# Patient Record
Sex: Male | Born: 1955
Health system: Southern US, Community
[De-identification: ages and names within clinical notes are randomized; demographics above are authoritative.]

---

## 1997-10-14 ENCOUNTER — Emergency Department (HOSPITAL_COMMUNITY): Admission: EM | Admit: 1997-10-14 | Discharge: 1997-10-14 | Payer: Self-pay | Admitting: Emergency Medicine

## 1997-10-22 ENCOUNTER — Emergency Department (HOSPITAL_COMMUNITY): Admission: EM | Admit: 1997-10-22 | Discharge: 1997-10-22 | Payer: Self-pay | Admitting: Internal Medicine

## 2011-02-19 ENCOUNTER — Emergency Department (HOSPITAL_COMMUNITY)
Admission: EM | Admit: 2011-02-19 | Discharge: 2011-02-20 | Disposition: A | Discharge status: Home / Self Care | Payer: Worker's Compensation | Attending: Emergency Medicine | Admitting: Emergency Medicine

## 2011-02-19 ENCOUNTER — Emergency Department (HOSPITAL_COMMUNITY): Payer: Worker's Compensation

## 2011-02-19 DIAGNOSIS — Y9229 Other specified public building as the place of occurrence of the external cause: Secondary | ICD-10-CM | POA: Insufficient documentation

## 2011-02-19 DIAGNOSIS — S62339A Displaced fracture of neck of unspecified metacarpal bone, initial encounter for closed fracture: Secondary | ICD-10-CM | POA: Insufficient documentation

## 2011-02-19 DIAGNOSIS — IMO0002 Reserved for concepts with insufficient information to code with codable children: Secondary | ICD-10-CM | POA: Insufficient documentation

## 2011-02-19 DIAGNOSIS — M79609 Pain in unspecified limb: Secondary | ICD-10-CM | POA: Insufficient documentation

## 2011-02-19 DIAGNOSIS — M7989 Other specified soft tissue disorders: Secondary | ICD-10-CM | POA: Insufficient documentation

## 2011-02-19 DIAGNOSIS — S63259A Unspecified dislocation of unspecified finger, initial encounter: Secondary | ICD-10-CM | POA: Insufficient documentation

## 2011-02-20 ENCOUNTER — Emergency Department (HOSPITAL_COMMUNITY): Payer: Worker's Compensation

## 2015-07-11 ENCOUNTER — Ambulatory Visit: Payer: Self-pay

## 2015-07-11 ENCOUNTER — Other Ambulatory Visit: Payer: Self-pay | Admitting: Occupational Medicine

## 2015-07-11 DIAGNOSIS — Z0000 Encounter for general adult medical examination without abnormal findings: Secondary | ICD-10-CM

## 2015-11-05 ENCOUNTER — Other Ambulatory Visit: Payer: Self-pay

## 2015-11-05 ENCOUNTER — Encounter (HOSPITAL_COMMUNITY): Payer: Self-pay | Admitting: Emergency Medicine

## 2015-11-05 ENCOUNTER — Emergency Department (HOSPITAL_COMMUNITY): Payer: No Typology Code available for payment source

## 2015-11-05 ENCOUNTER — Emergency Department (HOSPITAL_COMMUNITY)
Admission: EM | Admit: 2015-11-05 | Discharge: 2015-11-05 | Disposition: A | Discharge status: Home / Self Care | Payer: No Typology Code available for payment source | Attending: Emergency Medicine | Admitting: Emergency Medicine

## 2015-11-05 DIAGNOSIS — I2699 Other pulmonary embolism without acute cor pulmonale: Secondary | ICD-10-CM | POA: Diagnosis not present

## 2015-11-05 DIAGNOSIS — Y92410 Unspecified street and highway as the place of occurrence of the external cause: Secondary | ICD-10-CM | POA: Insufficient documentation

## 2015-11-05 DIAGNOSIS — S2020XA Contusion of thorax, unspecified, initial encounter: Secondary | ICD-10-CM | POA: Insufficient documentation

## 2015-11-05 DIAGNOSIS — R0789 Other chest pain: Secondary | ICD-10-CM

## 2015-11-05 DIAGNOSIS — S40012A Contusion of left shoulder, initial encounter: Secondary | ICD-10-CM | POA: Diagnosis not present

## 2015-11-05 DIAGNOSIS — S161XXA Strain of muscle, fascia and tendon at neck level, initial encounter: Secondary | ICD-10-CM | POA: Diagnosis not present

## 2015-11-05 DIAGNOSIS — Y999 Unspecified external cause status: Secondary | ICD-10-CM | POA: Insufficient documentation

## 2015-11-05 DIAGNOSIS — S199XXA Unspecified injury of neck, initial encounter: Secondary | ICD-10-CM | POA: Diagnosis present

## 2015-11-05 DIAGNOSIS — Y939 Activity, unspecified: Secondary | ICD-10-CM | POA: Diagnosis not present

## 2015-11-05 LAB — I-STAT CHEM 8, ED
BUN: 10 mg/dL (ref 6–20)
CHLORIDE: 103 mmol/L (ref 101–111)
CREATININE: 0.9 mg/dL (ref 0.61–1.24)
Calcium, Ion: 1.14 mmol/L (ref 1.12–1.23)
GLUCOSE: 142 mg/dL — AB (ref 65–99)
HCT: 48 % (ref 39.0–52.0)
Hemoglobin: 16.3 g/dL (ref 13.0–17.0)
POTASSIUM: 3.9 mmol/L (ref 3.5–5.1)
Sodium: 140 mmol/L (ref 135–145)
TCO2: 25 mmol/L (ref 0–100)

## 2015-11-05 LAB — ANTITHROMBIN III: AntiThromb III Func: 90 % (ref 75–120)

## 2015-11-05 MED ORDER — RIVAROXABAN 15 MG PO TABS: 15.0000 mg | ORAL_TABLET | Freq: Two times a day (BID) | ORAL | Status: DC

## 2015-11-05 MED ORDER — RIVAROXABAN 15 MG PO TABS
15.0000 mg | ORAL_TABLET | Freq: Once | ORAL | Status: AC
  Administered 2015-11-05: 15 mg via ORAL
  Filled 2015-11-05: qty 1

## 2015-11-05 MED ORDER — RIVAROXABAN (XARELTO) EDUCATION KIT FOR DVT/PE PATIENTS
PACK | Freq: Once | Status: AC
  Administered 2015-11-05: 21:00:00
  Filled 2015-11-05: qty 1

## 2015-11-05 MED ORDER — IOPAMIDOL (ISOVUE-300) INJECTION 61%
INTRAVENOUS | Status: AC
  Administered 2015-11-05: 75 mL
  Filled 2015-11-05: qty 75

## 2015-11-05 MED ORDER — RIVAROXABAN (XARELTO) VTE STARTER PACK (15 & 20 MG): ORAL_TABLET | ORAL | Status: AC

## 2015-11-05 NOTE — Discharge Instructions (Signed)
Take your medications twice daily as directed. Call first thing Monday morning to schedule a follow up appointment with your primary care provider.  Return to ER for shortness of breath / difficulty breathing, new cough, chest pain, new or worsening symptoms, any additional concerns.   [facility] [phone] [facility]: [address], [place] [address] [phone]
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
## 2015-11-06 LAB — BETA-2-GLYCOPROTEIN I ABS, IGG/M/A
Beta-2 Glyco I IgG: <9 GPI IgG units (ref 0–20)
Beta-2-Glycoprotein I IgA: <9 GPI IgA units (ref 0–25)
Beta-2-Glycoprotein I IgM: <9 GPI IgM units (ref 0–32)

## 2015-11-07 LAB — LUPUS ANTICOAGULANT PANEL
DRVVT: 43.5 s (ref 0.0–47.0)
PTT LA: 31.6 s (ref 0.0–43.6)

## 2015-11-07 LAB — PROTEIN C ACTIVITY: Protein C Activity: 90 % (ref 73–180)

## 2015-11-07 LAB — PROTEIN S, TOTAL: PROTEIN S AG TOTAL: 92 % (ref 60–150)

## 2015-11-07 LAB — PROTEIN S ACTIVITY: Protein S Activity: 111 % (ref 63–140)

## 2015-11-07 LAB — HOMOCYSTEINE: Homocysteine: 13 umol/L (ref 0.0–15.0)

## 2015-11-07 LAB — CARDIOLIPIN ANTIBODIES, IGG, IGM, IGA
Anticardiolipin IgA: 12 APL U/mL — ABNORMAL HIGH (ref 0–11)
Anticardiolipin IgG: <9 GPL U/mL (ref 0–14)

## 2015-11-08 LAB — PROTEIN C, TOTAL: Protein C, Total: 81 % (ref 60–150)

## 2015-11-10 LAB — PROTHROMBIN GENE MUTATION

## 2015-11-14 LAB — FACTOR 5 LEIDEN

## 2016-12-13 DIAGNOSIS — E1165 Type 2 diabetes mellitus with hyperglycemia: Secondary | ICD-10-CM | POA: Diagnosis not present

## 2016-12-13 DIAGNOSIS — I10 Essential (primary) hypertension: Secondary | ICD-10-CM | POA: Diagnosis not present

## 2016-12-13 DIAGNOSIS — K219 Gastro-esophageal reflux disease without esophagitis: Secondary | ICD-10-CM | POA: Diagnosis not present

## 2016-12-31 DIAGNOSIS — L03119 Cellulitis of unspecified part of limb: Secondary | ICD-10-CM | POA: Diagnosis not present

## 2016-12-31 DIAGNOSIS — I878 Other specified disorders of veins: Secondary | ICD-10-CM | POA: Diagnosis not present

## 2016-12-31 DIAGNOSIS — R030 Elevated blood-pressure reading, without diagnosis of hypertension: Secondary | ICD-10-CM | POA: Diagnosis not present

## 2017-01-07 DIAGNOSIS — L03119 Cellulitis of unspecified part of limb: Secondary | ICD-10-CM | POA: Diagnosis not present

## 2017-01-07 DIAGNOSIS — I878 Other specified disorders of veins: Secondary | ICD-10-CM | POA: Diagnosis not present

## 2017-01-23 DIAGNOSIS — E1165 Type 2 diabetes mellitus with hyperglycemia: Secondary | ICD-10-CM | POA: Diagnosis not present

## 2017-02-06 DIAGNOSIS — Z1211 Encounter for screening for malignant neoplasm of colon: Secondary | ICD-10-CM | POA: Diagnosis not present

## 2017-02-06 DIAGNOSIS — Z13228 Encounter for screening for other metabolic disorders: Secondary | ICD-10-CM | POA: Diagnosis not present

## 2017-02-06 DIAGNOSIS — Z0000 Encounter for general adult medical examination without abnormal findings: Secondary | ICD-10-CM | POA: Diagnosis not present

## 2017-02-06 DIAGNOSIS — Z125 Encounter for screening for malignant neoplasm of prostate: Secondary | ICD-10-CM | POA: Diagnosis not present

## 2017-02-13 DIAGNOSIS — Z23 Encounter for immunization: Secondary | ICD-10-CM | POA: Diagnosis not present

## 2017-02-13 DIAGNOSIS — E1165 Type 2 diabetes mellitus with hyperglycemia: Secondary | ICD-10-CM | POA: Diagnosis not present

## 2017-02-20 DIAGNOSIS — E039 Hypothyroidism, unspecified: Secondary | ICD-10-CM | POA: Diagnosis not present

## 2017-02-20 DIAGNOSIS — E1165 Type 2 diabetes mellitus with hyperglycemia: Secondary | ICD-10-CM | POA: Diagnosis not present

## 2017-02-20 DIAGNOSIS — E559 Vitamin D deficiency, unspecified: Secondary | ICD-10-CM | POA: Diagnosis not present

## 2017-02-20 DIAGNOSIS — Z1211 Encounter for screening for malignant neoplasm of colon: Secondary | ICD-10-CM | POA: Diagnosis not present

## 2017-02-27 DIAGNOSIS — E1165 Type 2 diabetes mellitus with hyperglycemia: Secondary | ICD-10-CM | POA: Diagnosis not present

## 2017-03-14 DIAGNOSIS — E1165 Type 2 diabetes mellitus with hyperglycemia: Secondary | ICD-10-CM | POA: Diagnosis not present

## 2017-03-14 DIAGNOSIS — I10 Essential (primary) hypertension: Secondary | ICD-10-CM | POA: Diagnosis not present

## 2017-03-14 DIAGNOSIS — E785 Hyperlipidemia, unspecified: Secondary | ICD-10-CM | POA: Diagnosis not present

## 2017-03-20 DIAGNOSIS — L97822 Non-pressure chronic ulcer of other part of left lower leg with fat layer exposed: Secondary | ICD-10-CM | POA: Diagnosis not present

## 2017-03-20 DIAGNOSIS — I872 Venous insufficiency (chronic) (peripheral): Secondary | ICD-10-CM | POA: Diagnosis not present

## 2017-03-21 DIAGNOSIS — I872 Venous insufficiency (chronic) (peripheral): Secondary | ICD-10-CM | POA: Diagnosis not present

## 2017-03-21 DIAGNOSIS — L97908 Non-pressure chronic ulcer of unspecified part of unspecified lower leg with other specified severity: Secondary | ICD-10-CM | POA: Diagnosis not present

## 2017-03-27 DIAGNOSIS — L97822 Non-pressure chronic ulcer of other part of left lower leg with fat layer exposed: Secondary | ICD-10-CM | POA: Diagnosis not present

## 2017-03-27 DIAGNOSIS — I872 Venous insufficiency (chronic) (peripheral): Secondary | ICD-10-CM | POA: Diagnosis not present

## 2017-04-03 DIAGNOSIS — I872 Venous insufficiency (chronic) (peripheral): Secondary | ICD-10-CM | POA: Diagnosis not present

## 2017-04-03 DIAGNOSIS — L97822 Non-pressure chronic ulcer of other part of left lower leg with fat layer exposed: Secondary | ICD-10-CM | POA: Diagnosis not present

## 2017-04-17 DIAGNOSIS — I872 Venous insufficiency (chronic) (peripheral): Secondary | ICD-10-CM | POA: Diagnosis not present

## 2017-04-17 DIAGNOSIS — L97822 Non-pressure chronic ulcer of other part of left lower leg with fat layer exposed: Secondary | ICD-10-CM | POA: Diagnosis not present

## 2017-04-24 DIAGNOSIS — I872 Venous insufficiency (chronic) (peripheral): Secondary | ICD-10-CM | POA: Diagnosis not present

## 2017-04-25 ENCOUNTER — Encounter: Payer: Self-pay | Admitting: Physician Assistant

## 2017-06-27 DIAGNOSIS — E1165 Type 2 diabetes mellitus with hyperglycemia: Secondary | ICD-10-CM | POA: Diagnosis not present

## 2017-06-27 DIAGNOSIS — I10 Essential (primary) hypertension: Secondary | ICD-10-CM | POA: Diagnosis not present

## 2017-06-27 DIAGNOSIS — K219 Gastro-esophageal reflux disease without esophagitis: Secondary | ICD-10-CM | POA: Diagnosis not present

## 2017-09-30 DIAGNOSIS — E785 Hyperlipidemia, unspecified: Secondary | ICD-10-CM | POA: Diagnosis not present

## 2017-09-30 DIAGNOSIS — E1165 Type 2 diabetes mellitus with hyperglycemia: Secondary | ICD-10-CM | POA: Diagnosis not present

## 2017-09-30 DIAGNOSIS — I10 Essential (primary) hypertension: Secondary | ICD-10-CM | POA: Diagnosis not present

## 2018-01-01 DIAGNOSIS — E1165 Type 2 diabetes mellitus with hyperglycemia: Secondary | ICD-10-CM | POA: Diagnosis not present

## 2018-01-01 DIAGNOSIS — I10 Essential (primary) hypertension: Secondary | ICD-10-CM | POA: Diagnosis not present

## 2018-01-01 DIAGNOSIS — K219 Gastro-esophageal reflux disease without esophagitis: Secondary | ICD-10-CM | POA: Diagnosis not present

## 2018-01-07 DIAGNOSIS — I10 Essential (primary) hypertension: Secondary | ICD-10-CM | POA: Diagnosis not present

## 2018-01-07 DIAGNOSIS — E039 Hypothyroidism, unspecified: Secondary | ICD-10-CM | POA: Diagnosis not present

## 2018-01-07 DIAGNOSIS — I878 Other specified disorders of veins: Secondary | ICD-10-CM | POA: Diagnosis not present
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
## 2018-04-08 DIAGNOSIS — E785 Hyperlipidemia, unspecified: Secondary | ICD-10-CM | POA: Diagnosis not present

## 2018-04-08 DIAGNOSIS — E1165 Type 2 diabetes mellitus with hyperglycemia: Secondary | ICD-10-CM | POA: Diagnosis not present

## 2018-05-28 DIAGNOSIS — M545 Low back pain: Secondary | ICD-10-CM | POA: Diagnosis not present

## 2018-05-28 DIAGNOSIS — M25551 Pain in right hip: Secondary | ICD-10-CM | POA: Diagnosis not present

## 2018-05-28 DIAGNOSIS — R112 Nausea with vomiting, unspecified: Secondary | ICD-10-CM | POA: Diagnosis not present

## 2018-06-11 DIAGNOSIS — M545 Low back pain: Secondary | ICD-10-CM | POA: Diagnosis not present

## 2018-06-11 DIAGNOSIS — E1165 Type 2 diabetes mellitus with hyperglycemia: Secondary | ICD-10-CM | POA: Diagnosis not present

## 2018-06-11 DIAGNOSIS — R112 Nausea with vomiting, unspecified: Secondary | ICD-10-CM | POA: Diagnosis not present

## 2018-06-16 ENCOUNTER — Encounter: Payer: Self-pay | Admitting: Physician Assistant

## 2018-07-15 DIAGNOSIS — I10 Essential (primary) hypertension: Secondary | ICD-10-CM | POA: Diagnosis not present

## 2018-07-15 DIAGNOSIS — E1165 Type 2 diabetes mellitus with hyperglycemia: Secondary | ICD-10-CM | POA: Diagnosis not present

## 2018-07-15 DIAGNOSIS — E785 Hyperlipidemia, unspecified: Secondary | ICD-10-CM | POA: Diagnosis not present

## 2022-05-28 ENCOUNTER — Other Ambulatory Visit: Payer: Self-pay | Admitting: Family Medicine

## 2022-05-28 ENCOUNTER — Ambulatory Visit: Payer: Self-pay

## 2022-05-28 DIAGNOSIS — Z0000 Encounter for general adult medical examination without abnormal findings: Secondary | ICD-10-CM

## 2023-01-14 ENCOUNTER — Other Ambulatory Visit: Payer: Self-pay

## 2023-01-14 ENCOUNTER — Emergency Department (HOSPITAL_BASED_OUTPATIENT_CLINIC_OR_DEPARTMENT_OTHER)
Admission: EM | Admit: 2023-01-14 | Discharge: 2023-01-14 | Disposition: A | Discharge status: Home / Self Care | Payer: Medicare Other | Source: Home / Self Care | Attending: Emergency Medicine | Admitting: Emergency Medicine

## 2023-01-14 ENCOUNTER — Encounter (HOSPITAL_BASED_OUTPATIENT_CLINIC_OR_DEPARTMENT_OTHER): Payer: Self-pay | Admitting: Emergency Medicine

## 2023-01-14 DIAGNOSIS — R739 Hyperglycemia, unspecified: Secondary | ICD-10-CM | POA: Diagnosis present

## 2023-01-14 DIAGNOSIS — I10 Essential (primary) hypertension: Secondary | ICD-10-CM | POA: Insufficient documentation

## 2023-01-14 LAB — BASIC METABOLIC PANEL
Anion gap: 10 (ref 5–15)
BUN: 11 mg/dL (ref 8–23)
CO2: 24 mmol/L (ref 22–32)
Calcium: 8.6 mg/dL — ABNORMAL LOW (ref 8.9–10.3)
Chloride: 100 mmol/L (ref 98–111)
Creatinine, Ser: 0.81 mg/dL (ref 0.61–1.24)
GFR, Estimated: >60 mL/min (ref >60)
Glucose, Bld: 301 mg/dL — ABNORMAL HIGH (ref 70–99)
Potassium: 3.6 mmol/L (ref 3.5–5.1)
Sodium: 134 mmol/L — ABNORMAL LOW (ref 135–145)

## 2023-01-14 LAB — CBC
HCT: 49.1 % (ref 39.0–52.0)
Hemoglobin: 17.8 g/dL — ABNORMAL HIGH (ref 13.0–17.0)
MCH: 31.4 pg (ref 26.0–34.0)
MCHC: 36.3 g/dL — ABNORMAL HIGH (ref 30.0–36.0)
MCV: 86.7 fL (ref 80.0–100.0)
Platelets: 186 10*3/uL (ref 150–400)
RBC: 5.66 MIL/uL (ref 4.22–5.81)
RDW: 11.9 % (ref 11.5–15.5)
WBC: 9.2 10*3/uL (ref 4.0–10.5)
nRBC: 0 % (ref 0.0–0.2)

## 2023-01-14 LAB — CBG MONITORING, ED: Glucose-Capillary: 298 mg/dL — ABNORMAL HIGH (ref 70–99)

## 2023-01-14 LAB — HEMOGLOBIN A1C
Hgb A1c MFr Bld: 10.9 % — ABNORMAL HIGH (ref 4.8–5.6)
Mean Plasma Glucose: 266.13 mg/dL

## 2023-01-14 MED ORDER — SODIUM CHLORIDE 0.9 % IV BOLUS
1000.0000 mL | Freq: Once | INTRAVENOUS | Status: AC
  Administered 2023-01-14: 1000 mL via INTRAVENOUS

## 2023-01-14 MED ORDER — METFORMIN HCL 500 MG PO TABS: 500.0000 mg | ORAL_TABLET | Freq: Two times a day (BID) | ORAL | 0 refills | Status: AC

## 2023-01-14 NOTE — ED Notes (Signed)
Patient verbalizes understanding of discharge instructions. Opportunity for questioning and answers were provided. Patient discharged from ED.  °

## 2023-01-14 NOTE — Discharge Instructions (Addendum)
You will need to follow-up with your doctor's clinic about the possibility that you have developed diabetes.  We started you on metformin at a low dose today.  This is an oral diabetes medicine.  But it may not be enough to control your blood sugar.  It is very important that you begin making changes to your diet.  In particular you should drink regular water all day, not sugar drinks or sweet teas.  You should also try to avoid sugary beverages, desserts, and carbohydrates, such as breads and pastas, which are heavy and sugar.

## 2023-01-14 NOTE — ED Provider Notes (Signed)
[place] EMERGENCY DEPARTMENT AT [facility] Provider Note   CSN: [id number] Arrival date & time: 01/14/23  1052     History  Chief Complaint  Patient presents with   Hyperglycemia    Walter Herrera is a 67 y.o. male presented to ED with concern for hyperglycemia.  Patient reports that his wife was checking his blood sugar this week because he has had excessive thirst for several weeks or months with frequent urination.  Noted his blood sugar was 400-500 on his wife's machine.  The patient does not have any prior history of diabetes but states that he had "borderline diabetes".  He is not on insulin or medications for blood sugars.  He reports that he does not watch his diet very carefully and tends to drink sweet tea all day.  HPI     Home Medications Prior to Admission medications   Medication Sig Start Date End Date Taking? Authorizing Provider  metFORMIN (GLUCOPHAGE) 500 MG tablet Take 1 tablet (500 mg total) by mouth 2 (two) times daily with a meal. 01/14/23 02/13/23 Yes Feige Lowdermilk, Kermit Balo, MD  Rivaroxaban 15 & 20 MG TBPK Take as directed on package: Start with one 15mg  tablet by mouth twice a day with food. On Day 22, switch to one 20mg  tablet once a day with food. 11/05/15   Ward, Chase Picket, PA-C      Allergies    Patient has no known allergies.    Review of Systems   Review of Systems  Physical Exam Updated Vital Signs BP (!) 145/113 (BP Location: Right Arm)   Pulse 66   Temp 98.6 F (37 C) (Temporal)   Resp 18   Ht 6' (1.829 m)   Wt 102.1 kg   SpO2 97%   BMI 30.52 kg/m  Physical Exam Constitutional:      General: He is not in acute distress. HENT:     Head: Normocephalic and atraumatic.  Eyes:     Conjunctiva/sclera: Conjunctivae normal.     Pupils: Pupils are equal, round, and reactive to light.  Cardiovascular:     Rate and Rhythm: Normal rate and regular rhythm.  Pulmonary:     Effort: Pulmonary effort is normal. No respiratory distress.   Abdominal:     General: There is no distension.     Tenderness: There is no abdominal tenderness.  Skin:    General: Skin is warm and dry.  Neurological:     General: No focal deficit present.     Mental Status: He is alert. Mental status is at baseline.  Psychiatric:        Mood and Affect: Mood normal.        Behavior: Behavior normal.     ED Results / Procedures / Treatments   Labs (all labs ordered are listed, but only abnormal results are displayed) Labs Reviewed  BASIC METABOLIC PANEL - Abnormal; Notable for the following components:      Result Value   Sodium 134 (*)    Glucose, Bld 301 (*)    Calcium 8.6 (*)    All other components within normal limits  CBC - Abnormal; Notable for the following components:   Hemoglobin 17.8 (*)    MCHC 36.3 (*)    All other components within normal limits  CBG MONITORING, ED - Abnormal; Notable for the following components:   Glucose-Capillary 298 (*)    All other components within normal limits  URINALYSIS, ROUTINE W REFLEX MICROSCOPIC  HEMOGLOBIN A1C  EKG None  Radiology No results found.  Procedures Procedures    Medications Ordered in ED Medications  sodium chloride 0.9 % bolus 1,000 mL (has no administration in time range)    ED Course/ Medical Decision Making/ A&P                                 Medical Decision Making Amount and/or Complexity of Data Reviewed Labs: ordered.  Risk Prescription drug management.   Patient is here with polyuria, polydipsia, elevated blood sugars.  This is consistent with new onset diabetes.  He also has a history of hypertension and has a higher blood pressure today.  We did discuss dietary changes at home including watching his carb and sugar intake, switching to drinking water.  I do think it is reasonable to obtain an A1c level although I explained this will not come back here in the ER.  He will need to follow-up with the PCP for this issue.  Will start him on metformin  500 mg twice daily, hydrate him with 1 L of fluid, and he can follow-up as an outpatient.  I do not see evidence of DKA at this time and no indication for hospitalization.        Final Clinical Impression(s) / ED Diagnoses Final diagnoses:  Hyperglycemia  Hypertension, unspecified type    Rx / DC Orders ED Discharge Orders          Ordered    metFORMIN (GLUCOPHAGE) 500 MG tablet  2 times daily with meals        01/14/23 1210              Terald Sleeper, MD 01/14/23 1212

## 2023-01-14 NOTE — ED Triage Notes (Signed)
Pt arrives to ED with c/o hyperglycemia, polydipsia, and urinary frequency. Pt notes using his wifes glucometer last night and his blood sugar was 530.
